# Patient Record
Sex: Male | Born: 1985 | Race: White | Hispanic: No | Marital: Single | Smoking: Former smoker
Health system: Southern US, Community
[De-identification: ages and names within clinical notes are randomized; demographics above are authoritative.]

## PROBLEM LIST (undated history)

## (undated) DIAGNOSIS — Z720 Tobacco use: Secondary | ICD-10-CM

## (undated) HISTORY — PX: FRACTURE SURGERY: SHX138

## (undated) HISTORY — DX: Tobacco use: Z72.0

---
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
## 2016-08-30 ENCOUNTER — Emergency Department (HOSPITAL_COMMUNITY)
Admission: EM | Admit: 2016-08-30 | Discharge: 2016-08-30 | Disposition: A | Discharge status: Home / Self Care | Payer: 59 | Attending: Emergency Medicine | Admitting: Emergency Medicine

## 2016-08-30 ENCOUNTER — Emergency Department (HOSPITAL_COMMUNITY): Payer: 59

## 2016-08-30 ENCOUNTER — Encounter (HOSPITAL_COMMUNITY): Payer: Self-pay | Admitting: Emergency Medicine

## 2016-08-30 DIAGNOSIS — Z7982 Long term (current) use of aspirin: Secondary | ICD-10-CM | POA: Diagnosis not present

## 2016-08-30 DIAGNOSIS — F17210 Nicotine dependence, cigarettes, uncomplicated: Secondary | ICD-10-CM | POA: Diagnosis not present

## 2016-08-30 DIAGNOSIS — R079 Chest pain, unspecified: Secondary | ICD-10-CM | POA: Diagnosis present

## 2016-08-30 DIAGNOSIS — R0789 Other chest pain: Secondary | ICD-10-CM | POA: Diagnosis not present

## 2016-08-30 DIAGNOSIS — Z79899 Other long term (current) drug therapy: Secondary | ICD-10-CM | POA: Diagnosis not present

## 2016-08-30 LAB — CBC WITH DIFFERENTIAL/PLATELET
Basophils Absolute: 0 10*3/uL (ref 0.0–0.1)
Basophils Relative: 0 %
EOS ABS: 0.2 10*3/uL (ref 0.0–0.7)
Eosinophils Relative: 3 %
HEMATOCRIT: 42.6 % (ref 39.0–52.0)
HEMOGLOBIN: 15.2 g/dL (ref 13.0–17.0)
LYMPHS ABS: 2 10*3/uL (ref 0.7–4.0)
Lymphocytes Relative: 26 %
MCH: 30.5 pg (ref 26.0–34.0)
MCHC: 35.7 g/dL (ref 30.0–36.0)
MCV: 85.4 fL (ref 78.0–100.0)
MONO ABS: 0.5 10*3/uL (ref 0.1–1.0)
MONOS PCT: 7 %
Neutro Abs: 5 10*3/uL (ref 1.7–7.7)
Neutrophils Relative %: 64 %
Platelets: 170 10*3/uL (ref 150–400)
RBC: 4.99 MIL/uL (ref 4.22–5.81)
RDW: 12.3 % (ref 11.5–15.5)
WBC: 7.8 10*3/uL (ref 4.0–10.5)

## 2016-08-30 LAB — COMPREHENSIVE METABOLIC PANEL
ALK PHOS: 63 U/L (ref 38–126)
ALT: 27 U/L (ref 17–63)
AST: 24 U/L (ref 15–41)
Albumin: 4.9 g/dL (ref 3.5–5.0)
Anion gap: 6 (ref 5–15)
BILIRUBIN TOTAL: 0.7 mg/dL (ref 0.3–1.2)
BUN: 16 mg/dL (ref 6–20)
CALCIUM: 9.1 mg/dL (ref 8.9–10.3)
CO2: 25 mmol/L (ref 22–32)
Chloride: 105 mmol/L (ref 101–111)
Creatinine, Ser: 1.09 mg/dL (ref 0.61–1.24)
GFR calc non Af Amer: >60 mL/min (ref >60)
Glucose, Bld: 100 mg/dL — ABNORMAL HIGH (ref 65–99)
Potassium: 4 mmol/L (ref 3.5–5.1)
Sodium: 136 mmol/L (ref 135–145)
TOTAL PROTEIN: 7.3 g/dL (ref 6.5–8.1)

## 2016-08-30 LAB — I-STAT TROPONIN, ED
TROPONIN I, POC: 0 ng/mL (ref 0.00–0.08)
Troponin i, poc: 0 ng/mL (ref 0.00–0.08)

## 2016-08-30 NOTE — ED Notes (Signed)
EDP at bedside  

## 2016-08-30 NOTE — ED Provider Notes (Signed)
[facility]Y DEPT Provider Note   CSN: [id number] Arrival date & time: 08/30/16  1539     History   Chief Complaint Chief Complaint  Patient presents with  . Chest Pain    HPI Gary Holmes is a 31 y.o. male.  Patient presents with history of palpitations, diaphoresis, left-sided chest pain, lightheadedness occurred approximately 5 hours, while at work. Patient states that he all of a sudden became sweaty with associated chest pain after which he took off his shirt and stood in front of a fan. States the sweating resolved but palpitations continued . He went to the fire station next-door and states that when his blood pressure was checked it was 170/110. They were unable to check his blood sugar due to his hands being cold. Patient states he had a normal meal for breakfast this morning. He states he has never had shortness of breath at baseline or with increased activity. Denies appetite changes, urinary symptoms, bowel changes, headache, vision changes, prior cardiac history, prior cardiac workup, prior history of the symptoms, syncope, recent illness, fever, recent injury. Of note, patient states that he has not seen a primary care provider in several years. States family history of CAD, his paternal grandfather died of a heart attack at age 33, and his father had a heart attack in his 53s as well.      History reviewed. No pertinent past medical history.  There are no active problems to display for this patient.   Past Surgical History:  Procedure Laterality Date  . FRACTURE SURGERY         Home Medications    Prior to Admission medications   Medication Sig Start Date End Date Taking? Authorizing Provider  acetaminophen (TYLENOL) 500 MG tablet Take 1,000 mg by mouth every 6 (six) hours as needed for headache (pain).   Yes Historical Provider, MD  aspirin EC 81 MG tablet Take 81 mg by mouth at bedtime.   Yes Historical Provider, MD  Multiple Vitamin (MULTIVITAMIN  WITH MINERALS) TABS tablet Take 1 tablet by mouth at bedtime.   Yes Historical Provider, MD  Omega-3 Fatty Acids (FISH OIL) 1000 MG CAPS Take 1,000 mg by mouth at bedtime.   Yes Historical Provider, MD    Family History No family history on file.  Social History Social History  Substance Use Topics  . Smoking status: Current Some Day Smoker  . Smokeless tobacco: Current User    Types: Chew  . Alcohol use Yes     Allergies   Patient has no known allergies.   Review of Systems Review of Systems  Constitutional: Positive for diaphoresis. Negative for appetite change, chills and fever.  HENT: Negative for ear pain, rhinorrhea, sneezing and sore throat.   Eyes: Negative for photophobia and visual disturbance.  Respiratory: Negative for cough, chest tightness, shortness of breath and wheezing.   Cardiovascular: Positive for chest pain and palpitations.  Gastrointestinal: Negative for abdominal pain, blood in stool, constipation, diarrhea, nausea and vomiting.  Genitourinary: Negative for dysuria, hematuria and urgency.  Musculoskeletal: Negative for myalgias.  Skin: Negative for rash.  Neurological: Positive for light-headedness. Negative for dizziness and weakness.     Physical Exam Updated Vital Signs BP 121/64   Pulse 66   Temp 97.8 F (36.6 C) (Oral)   Resp 20   Ht  (1.803 m)   Wt 104.3 kg   SpO2 100%   BMI 32.08 kg/m   Physical Exam  Constitutional: He is oriented to  person, place, and time. He appears well-developed and well-nourished. No distress.  HENT:  Head: Normocephalic and atraumatic.  Nose: Nose normal.  Eyes: Conjunctivae and EOM are normal. Pupils are equal, round, and reactive to light. Right eye exhibits no discharge. Left eye exhibits no discharge. No scleral icterus.  Neck: Normal range of motion. Neck supple.  Cardiovascular: Normal rate, regular rhythm, normal heart sounds and intact distal pulses.  Exam reveals no gallop and no friction  rub.   No murmur heard. Pulmonary/Chest: Effort normal and breath sounds normal. No respiratory distress.  Abdominal: Soft. Bowel sounds are normal. He exhibits no distension. There is no tenderness. There is no guarding.  Musculoskeletal: Normal range of motion. He exhibits no edema.  Neurological: He is alert and oriented to person, place, and time. He exhibits normal muscle tone. Coordination normal.  Skin: Skin is warm and dry. No rash noted. He is not diaphoretic.  Psychiatric: He has a normal mood and affect.  Nursing note and vitals reviewed.    ED Treatments / Results  Labs (all labs ordered are listed, but only abnormal results are displayed) Labs Reviewed  COMPREHENSIVE METABOLIC PANEL - Abnormal; Notable for the following:       Result Value   Glucose, Bld 100 (*)    All other components within normal limits  CBC WITH DIFFERENTIAL/PLATELET  Rosezena Sensor, ED  I-STAT TROPOININ, ED    EKG  EKG Interpretation  Date/Time:  Sunday August 30 2016 15:44:11 EDT Ventricular Rate:  72 PR Interval:  154 QRS Duration: 88 QT Interval:  378 QTC Calculation: 413 R Axis:   69 Text Interpretation:  Normal sinus rhythm Normal ECG Confirmed by Fayrene Fearing  MD, MARK ([id number]) on 08/30/2016 5:24:41 PM       Radiology Dg Chest 2 View  Result Date: 08/30/2016 CLINICAL DATA:  Left-sided chest pain and tachycardia EXAM: CHEST  2 VIEW COMPARISON:  None. FINDINGS: The heart size and mediastinal contours are within normal limits. Both lungs are clear. The visualized skeletal structures are unremarkable. IMPRESSION: No active cardiopulmonary disease. Electronically Signed   By: Alcide Clever M.D.   On: 08/30/2016 17:08    Procedures Procedures (including critical care time)  Medications Ordered in ED Medications - No data to display   Initial Impression / Assessment and Plan / ED Course  I have reviewed the triage vital signs and the nursing notes.  Pertinent labs & imaging results that  were available during my care of the patient were reviewed by me and considered in my medical decision making (see chart for details).     Patient's history and symptoms concerning for dehydration vs. Low likelihood of ACS versus PNA vs. Arrhythmia vs. Anemia vs. PE. Troponin negative x1. CBC and CMP unremarkable. EKG showed NSR with no WPW, Brugada, long QT, LVH, SVT. CXR showed no active cardiopulmonary disease. Cardiac monitor showed stable heart rhythm. Vital signs are stable in the room. Patient states that all of his symptoms have resolved. Low likelihood of PE, due to the absence of leg swelling, absence of cancer history, normal heart rate currently, no hemoptysis, no prior clots, no recent surgery or immobilization. It is important that patient follows up with cardiologist and establish a PCP. He is aware that he has not seen a PCP in several years and I educated him on the importance of preventative medicine.  Patient has a HEART score of 2 based on history and risk factors. This warrants a delta troponin which returned as  negative as well. Symptoms likely due to arrhythmia such as SVT and less likely CAD. However will need further evaluation. Patient appears stable here in the ED. Information given as stated above regarding scheduling appointments with cardiologist and PCP. Return precautions given.    Final Clinical Impressions(s) / ED Diagnoses   Final diagnoses:  Chest wall pain    New Prescriptions New Prescriptions   No medications on file     Brooks Sailors 08/30/16 1937    Rolland Porter, MD 09/05/16 1650

## 2016-08-30 NOTE — ED Notes (Signed)
Pt updated on plan to repeat Troponin @ 1900.  Pt hungry.  Happy meal, peanut butter, crackers, and sprite given to pt.  Drink also given to spouse.

## 2016-08-30 NOTE — ED Notes (Signed)
Pt to xray

## 2016-08-30 NOTE — ED Provider Notes (Signed)
Patient seen and evaluated, discussed  with PA Hina Khatri.  She describes episode of diaphoresis palpitations and anterior chest pain. Half an hour later as he was improving presented to a fire station and underwent vital signs. He does not recall being told that his pulse was but his blood pressure was high for him at 170/110. Heart score of 2. A symptomatically here. Normal vitals. Heart rate 50 EKG normal first troponin normal. Recommend second troponin.  Arrhythmia is a possibility. I recommended the patient routinely follow up with cardiology. If he has recurrence between now and cardiology evaluation, I've recommended he go immediately to Hospital or call ambulance so that his rhythm can be captured. Avoid caffeine. ER with acute changes.   Rolland Porter, MD 08/30/16 [phone]

## 2016-08-30 NOTE — Discharge Instructions (Signed)
Please schedule an appointment with cardiologist listed below. Please establish primary care with information given below. Return to ED for worsening pain, trouble breathing, leg swelling, coughing up blood, vision changes, additional injury, persistent chest pain.

## 2016-08-30 NOTE — ED Triage Notes (Signed)
Pt to this am he felt his heart beating fast and became diaphoretic. St's he felt like he was going to pass out.  At this time pt denies any pain or any other symptoms at this time

## 2016-08-30 NOTE — ED Notes (Signed)
Returned from xray

## 2016-09-11 ENCOUNTER — Encounter: Payer: Self-pay | Admitting: Physician Assistant

## 2016-09-14 ENCOUNTER — Encounter: Payer: Self-pay | Admitting: Physician Assistant

## 2016-09-14 DIAGNOSIS — R002 Palpitations: Secondary | ICD-10-CM | POA: Insufficient documentation

## 2016-09-14 DIAGNOSIS — R61 Generalized hyperhidrosis: Secondary | ICD-10-CM | POA: Insufficient documentation

## 2016-09-14 DIAGNOSIS — R0789 Other chest pain: Secondary | ICD-10-CM | POA: Insufficient documentation

## 2016-09-14 NOTE — Progress Notes (Signed)
Cardiology Office Note    Date:  09/15/2016  ID:  Gary Holmes, DOB Sep 25, 1985, MRN [id number] PCP:  No PCP Per Patient  Cardiologist:  New, reviewed with Dr. Elease Hashimoto   Chief Complaint: palpitations, chest pain  History of Present Illness:  Gary Holmes is a 31 y.o. male [facility] with history of no significant PMH except social tobacco and alcohol use who was recently seen in the ED for cardiac evaluation. Two weeks ago, he was seated at work when he developed profuse sweating and left sided chest tightness associated with palpitations/racing heart and lightheadedness. He took all his gear off and stood in front of the fan with no relief. Symptoms seemed to persist for about an hour and a half. His coworkers convinced him to go to the fire station next-door and states that when his blood pressure was checked it was 170/110. HR unknown. Symptoms had improved spontaneously. His wife took him to urgent care to be checked out but he developed recurrent diaphoresis in the waiting room there so they were referred to the ED. Troponins neg x 2. CMET and CBC wnl except glucose 100 (1 pt above normal). BP in ED 121/64, pulse ox 100%. CXR NAD. He has not had any recurrent episodes. He works out regularly to stay active for his job. 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 No chest pain, dyspnea, syncope. No other symptoms including LEE. No family history of sudden cardiac death but + family hx of premature CAD. Father had MI at 61 and his paternal grandfather had MI at 55 (multiple other family members on father's side also had heart issues in their 70s). [name] last smoked a year ago but dips regularly - has found it hard to quit this because so many people at his job do. There was a biometric screening at his job recently and he states his BP was 130s/70s. He also states he had his cholesterol checked by them and was told it was  OK. He is asymptomatic today. He has no prior history of cardiac workup or similar symptoms.    Past Medical History:  Diagnosis Date  . Tobacco use     Past Surgical History:  Procedure Laterality Date  . FRACTURE SURGERY      Current Medications: Current Outpatient Prescriptions  Medication Sig Dispense Refill  . acetaminophen (TYLENOL) 500 MG tablet Take 1,000 mg by mouth every 6 (six) hours as needed for headache (pain).    Marland Kitchen aspirin EC 81 MG tablet Take 81 mg by mouth at bedtime.    . Multiple Vitamin (MULTIVITAMIN WITH MINERALS) TABS tablet Take 1 tablet by mouth at bedtime.    . Omega-3 Fatty Acids (FISH OIL) 1000 MG CAPS Take 1,000 mg by mouth at bedtime.     No current facility-administered medications for this visit.      Allergies:   Patient has no known allergies.   Social History   Social History  . Marital status: Single    Spouse name: N/A  . Number of children: N/A  . Years of education: N/A   Social History Main Topics  . Smoking status: Passive Smoke Exposure - Never Smoker    Packs/day: 0.25    Types: Cigarettes  . Smokeless tobacco: Current User    Types: Snuff     Comment: Occasional smoker when drinking beer only, also dips regularly  . Alcohol use Yes  Comment: Attends parties every other weekend  . Drug use: No  . Sexual activity: Not Asked   Other Topics Concern  . None   Social History Narrative  . None     Family History:  Family History  Problem Relation Age of Onset  . Heart attack Father 22  . Heart attack Paternal Grandfather 47  . Fainting Paternal Uncle      ROS:   Please see the history of present illness.  All other systems are reviewed and otherwise negative.    PHYSICAL EXAM:   VS:  BP 134/90   Pulse 75   Ht  (1.803 m)   Wt 234 lb 4 oz (106.3 kg)   SpO2 97%   BMI 32.67 kg/m   BMI: Body mass index is 32.67 kg/m. GEN: Well nourished, well developed WM, in no acute distress  HEENT: normocephalic,  atraumatic Neck: no JVD, carotid bruits, or masses Cardiac: RRR; no murmurs, rubs, or gallops, no edema  Respiratory:  clear to auscultation bilaterally, normal work of breathing GI: soft, nontender, nondistended, + BS MS: no deformity or atrophy  Skin: warm and dry, no rash Neuro:  Alert and Oriented x 3, Strength and sensation are intact, follows commands Psych: euthymic mood, full affect  Wt Readings from Last 3 Encounters:  09/15/16 234 lb 4 oz (106.3 kg)  08/30/16 230 lb (104.3 kg)      Studies/Labs Reviewed:   EKG:  EKG was ordered today and personally reviewed by me and demonstrates NSR 79bpm, TWI III, otherwise normal. No delta wave, PR , QTc . No sig change from ED EKG.  Recent Labs: 08/30/2016: ALT 27; BUN 16; Creatinine, Ser 1.09; Hemoglobin 15.2; Platelets 170; Potassium 4.0; Sodium 136   Lipid Panel No results found for: CHOL, TRIG, HDL, CHOLHDL, VLDL, LDLCALC, LDLDIRECT  Additional studies/ records that were reviewed today include: Summarized above.    ASSESSMENT & PLAN:   The patient's case was reviewed with Dr. Elease Hashimoto since this is a new patient to our clinic. Plan formulated below per our discussion.  1. Heart palpitations - somewhat atypical, in NSR at time of ED visit. Question vagal event. However, cannot rule out SVT. Will check TSH today. I instructed him that if he has any recurrence to call EMS immediately so we can see if we can capture any arrhythmias on 12-lead at time of ongoing symptoms. Will await EF assessment by stress echo as discussed below. If symptoms recur, will need event monitoring. Exam and EKG are benign. 2. Chest pain - mixed atypical/typical features. Has been able to exercise since the episode without anginal symptoms. Will proceed with stress echocardiogram for risk stratification. This will allow [place] to observe for any arrhythmias, BP response or ischemia. Continue aspirin. We discussed importance of working on his risk factors  especially given fam hx. He will bring in a copy of his lipid panel when he returns for his stress test for [place] to scan in. Would advocate for tight lipid control if LDL is elevated. 3. Elevated blood pressure without diagnosis of hypertension - discussed lifestyle modifications including cutting down alcohol use. F/u BP response to exercise. Also asked patient to monitor episodically and bring in readings to f/u appointment.  4. Tobacco abuse/habitual alcohol intake - counseled regarding importance of complete cessation particularly in light of family history.  Disposition: F/u with myself after above testing.   Medication Adjustments/Labs and Tests Ordered: Current medicines are reviewed at length with the patient today.  Concerns regarding medicines are outlined above. Medication changes, Labs and Tests ordered today are summarized above and listed in the Patient Instructions accessible in Encounters.   Thomasene Mohair PA-C  09/15/2016 9:29 AM    [facility] [address] [place], [place], [place]  [address] Phone: [phone]; Fax: [phone]

## 2016-09-15 ENCOUNTER — Encounter: Payer: Self-pay | Admitting: Physician Assistant

## 2016-09-15 ENCOUNTER — Encounter (INDEPENDENT_AMBULATORY_CARE_PROVIDER_SITE_OTHER): Payer: Self-pay

## 2016-09-15 ENCOUNTER — Ambulatory Visit (INDEPENDENT_AMBULATORY_CARE_PROVIDER_SITE_OTHER): Payer: 59 | Admitting: Physician Assistant

## 2016-09-15 VITALS — BP 134/90 | HR 75 | Ht 71.0 in | Wt 234.2 lb

## 2016-09-15 DIAGNOSIS — R002 Palpitations: Secondary | ICD-10-CM

## 2016-09-15 DIAGNOSIS — R030 Elevated blood-pressure reading, without diagnosis of hypertension: Secondary | ICD-10-CM

## 2016-09-15 DIAGNOSIS — Z7289 Other problems related to lifestyle: Secondary | ICD-10-CM

## 2016-09-15 DIAGNOSIS — Z720 Tobacco use: Secondary | ICD-10-CM | POA: Diagnosis not present

## 2016-09-15 DIAGNOSIS — F1090 Alcohol use, unspecified, uncomplicated: Secondary | ICD-10-CM

## 2016-09-15 DIAGNOSIS — R072 Precordial pain: Secondary | ICD-10-CM

## 2016-09-15 LAB — TSH: TSH: 2.07 u[IU]/mL (ref 0.450–4.500)

## 2016-09-15 NOTE — Patient Instructions (Addendum)
Medication Instructions:    Your physician recommends that you continue on your current medications as directed. Please refer to the Current Medication list given to you today.  Labwork:  TSH today  Testing/Procedures: Your physician has requested that you have a stress echocardiogram. For further information please visit [email or web address] Please follow instruction sheet as given.  Follow-Up:  Your physician recommends that you schedule a follow-up appointment with Ronie Spies, PA once stress echocardiogram is completed.  - If you need a refill on your cardiac medications before your next appointment, please call your pharmacy.   Thank you for choosing [facility]!!    Any Other Special Instructions Will Be Listed Below (If Applicable).  - Please bring a copy of your recent cholesterol check when you come back to the office.   Exercise Stress Electrocardiogram An exercise stress electrocardiogram is a test to check how blood flows to your heart. It is done to find areas of poor blood flow. You will need to walk on a treadmill for this test. The electrocardiogram will record your heartbeat when you are at rest and when you are exercising. What happens before the procedure?  Do not have drinks with caffeine or foods with caffeine for 24 hours before the test, or as told by your doctor. This includes coffee, tea (even decaf tea), sodas, chocolate, and cocoa.  Follow your doctor's instructions about eating and drinking before the test.  Ask your doctor what medicines you should or should not take before the test. Take your medicines with water unless told by your doctor not to.  If you use an inhaler, bring it with you to the test.  Bring a snack to eat after the test.  Do not  smoke for 4 hours before the test.  Do not put lotions, powders, creams, or oils on your chest before the test.  Wear comfortable shoes and clothing. What happens during the procedure?  You will  have patches put on your chest. Small areas of your chest may need to be shaved. Wires will be connected to the patches.  Your heart rate will be watched while you are resting and while you are exercising.  You will walk on the treadmill. The treadmill will slowly get faster to raise your heart rate.  The test will take about 1-2 hours. What happens after the procedure?  Your heart rate and blood pressure will be watched after the test.  You may return to your normal diet, activities, and medicines or as told by your doctor. This information is not intended to replace advice given to you by your health care provider. Make sure you discuss any questions you have with your health care provider. Document Released: 10/21/2007 Document Revised: 01/01/2016 Document Reviewed: 01/09/2013 Elsevier Interactive Patient Education  2017 [employer].

## 2016-09-16 NOTE — Addendum Note (Signed)
Addended by: Luella Cook E on: 09/16/2016 11:42 AM   Modules accepted: Orders

## 2016-09-24 ENCOUNTER — Other Ambulatory Visit: Payer: Self-pay | Admitting: *Deleted

## 2016-09-24 DIAGNOSIS — R002 Palpitations: Secondary | ICD-10-CM

## 2016-09-24 DIAGNOSIS — R072 Precordial pain: Secondary | ICD-10-CM

## 2016-10-01 ENCOUNTER — Telehealth (HOSPITAL_COMMUNITY): Payer: Self-pay | Admitting: *Deleted

## 2016-10-01 NOTE — Telephone Encounter (Signed)
Patient given detailed instructions per Stress Test Requisition Sheet for test on 10/08/16 at 2:30.Patient Notified to arrive 30 minutes early, and that it is imperative to arrive on time for appointment to keep from having the test rescheduled.  Patient verbalized understanding. Daneil DolinSharon S Brooks

## 2016-10-07 ENCOUNTER — Other Ambulatory Visit (HOSPITAL_COMMUNITY): Payer: 59

## 2016-10-08 ENCOUNTER — Other Ambulatory Visit: Payer: Self-pay

## 2016-10-08 ENCOUNTER — Ambulatory Visit (HOSPITAL_COMMUNITY): Payer: 59 | Attending: Cardiovascular Disease

## 2016-10-08 ENCOUNTER — Ambulatory Visit (HOSPITAL_BASED_OUTPATIENT_CLINIC_OR_DEPARTMENT_OTHER): Payer: 59

## 2016-10-08 ENCOUNTER — Ambulatory Visit (HOSPITAL_COMMUNITY): Payer: 59

## 2016-10-08 DIAGNOSIS — Z720 Tobacco use: Secondary | ICD-10-CM | POA: Insufficient documentation

## 2016-10-08 DIAGNOSIS — R072 Precordial pain: Secondary | ICD-10-CM | POA: Diagnosis not present

## 2016-10-08 DIAGNOSIS — R002 Palpitations: Secondary | ICD-10-CM | POA: Diagnosis not present

## 2016-10-08 DIAGNOSIS — Z8249 Family history of ischemic heart disease and other diseases of the circulatory system: Secondary | ICD-10-CM | POA: Diagnosis not present

## 2016-10-08 DIAGNOSIS — I10 Essential (primary) hypertension: Secondary | ICD-10-CM | POA: Diagnosis not present

## 2016-10-08 DIAGNOSIS — I071 Rheumatic tricuspid insufficiency: Secondary | ICD-10-CM | POA: Diagnosis not present

## 2016-10-08 DIAGNOSIS — R0600 Dyspnea, unspecified: Secondary | ICD-10-CM | POA: Diagnosis not present

## 2016-10-14 ENCOUNTER — Telehealth: Payer: Self-pay | Admitting: Cardiovascular Disease

## 2016-10-14 NOTE — Telephone Encounter (Signed)
Mr. Neysa BonitoChristy is wanting to have his cholesterol check on the day of his appt next week. Will need orders put in . Thanks

## 2016-10-14 NOTE — Telephone Encounter (Signed)
Returned pts call.  He will bring what he has that was recently checked.  Pt was advised to come to his appt fasting and if Dayn Dunn, PA-C, wanted to recheck anything, they will order it at that time.  Pt verbalized understanding.

## 2016-10-20 NOTE — Progress Notes (Signed)
Cardiology Office Note    Date:  10/21/2016  ID:  Gary Holmes, DOB 12/29/1985, MRN [id number] PCP:  Patient, No Pcp Per  Cardiologist:  Reviewed with Dr. Elease Hashimoto last visit   Chief Complaint: f/u chest pain and palpitations  History of Present Illness:  Gary Holmes is a 31 y.o. male with history of [facility] with history of no significant PMH except social tobacco and alcohol use who presents for post-testing follow-up.  In 08/2016 he was seated at work when he developed profuse sweating and left sided chest tightness associated with palpitations/racing heart and lightheadedness. He took all his gear off and stood in front of the fan with no relief. Symptoms seemed to persist for about an hour and a half. His coworkers convinced him to go to the fire station next-door and states that when his blood pressure was checked it was 170/110. HR unknown. Symptoms had improved spontaneously. His wife took him to urgent care to be checked out but he developed recurrent diaphoresis in the waiting room there so they were referred to the ED. Troponins neg x 2. CMET and CBC unremarkable, TSH wnl. BP in ED 121/64, pulse ox 100%. CXR NAD. He was asked to f/u with cardiology. At time of that OV, no further sx - back to exercising without functional limitation. BP was 134/90. He also reported recent biometric screening at his job and was told it was OK. No family history of sudden cardiac death but + family hx of premature CAD. Father had MI at 37 and his paternal grandfather had MI at 65 (multiple other family members on father's side also had heart issues in their 54s). 2D echo 10/08/16 was normal, EF 60-65%, mildly dilated RA. Stress echo 10/08/16 was normal.  He presents back for follow-up today feeling well. He's not had any further episodes like the above. He has established with primary care at [facility] and they plan for a CPE in another month. He's been  exercising without any adverse limitation. He quit tobacco. His BP today is on the lower side but he is asymptomatic. He brings in his biometric screening today which showed a total cholesterol of 215 by fingerstick but did not have information regarding his triglycerides or LDL.    Past Medical History:  Diagnosis Date  . Tobacco use     Past Surgical History:  Procedure Laterality Date  . FRACTURE SURGERY      Current Medications: Current Outpatient Prescriptions  Medication Sig Dispense Refill  . acetaminophen (TYLENOL) 500 MG tablet Take 1,000 mg by mouth every 6 (six) hours as needed for headache (pain).    Marland Kitchen aspirin EC 81 MG tablet Take 81 mg by mouth at bedtime.    . Multiple Vitamin (MULTIVITAMIN WITH MINERALS) TABS tablet Take 1 tablet by mouth at bedtime.    . Omega-3 Fatty Acids (FISH OIL) 1000 MG CAPS Take 1,000 mg by mouth at bedtime.     No current facility-administered medications for this visit.      Allergies:   Patient has no known allergies.   Social History   Social History  . Marital status: Single    Spouse name: N/A  . Number of children: N/A  . Years of education: N/A   Social History Main Topics  . Smoking status: Former Smoker    Packs/day: 0.25    Types: Cigarettes  . Smokeless tobacco: Current User    Types: Snuff  Comment: Occasional smoker when drinking beer only, also dips regularly  . Alcohol use Yes     Comment: Attends parties every other weekend  . Drug use: No  . Sexual activity: Not Asked   Other Topics Concern  . None   Social History Narrative  . None     Family History:  Family History  Problem Relation Age of Onset  . Heart attack Father 5440  . Heart attack Paternal Grandfather 6032  . Fainting Paternal Uncle     ROS:   Please see the history of present illness.  All other systems are reviewed and otherwise negative.    PHYSICAL EXAM:   VS:  BP 100/78   Pulse 60   Ht 5\' 10"  (1.778 m)   Wt 235 lb (106.6 kg)    BMI 33.72 kg/m   BMI: Body mass index is 33.72 kg/m. GEN: Well nourished, well developed fit appearing WM, in no acute distress  HEENT: normocephalic, atraumatic Neck: no JVD, carotid bruits, or masses Cardiac: RRR; no murmurs, rubs, or gallops, no edema  Respiratory:  clear to auscultation bilaterally, normal work of breathing GI: soft, nontender, nondistended, + BS MS: no deformity or atrophy  Skin: warm and dry, no rash Neuro:  Alert and Oriented x 3, Strength and sensation are intact, follows commands Psych: euthymic mood, full affect  Wt Readings from Last 3 Encounters:  10/21/16 235 lb (106.6 kg)  09/15/16 234 lb 4 oz (106.3 kg)  08/30/16 230 lb (104.3 kg)      Studies/Labs Reviewed:   EKG:  EKG was not ordered today.  Recent Labs: 08/30/2016: ALT 27; BUN 16; Creatinine, Ser 1.09; Hemoglobin 15.2; Platelets 170; Potassium 4.0; Sodium 136 09/15/2016: TSH 2.070   Lipid Panel No results found for: CHOL, TRIG, HDL, CHOLHDL, VLDL, LDLCALC, LDLDIRECT  Additional studies/ records that were reviewed today include: Summarized above    ASSESSMENT & PLAN:   1. Heart palpitations - no further recurrence. Etiology not clear at this time. We discussed that if this should occur again, he should call EMS immediately so that they can capture an EKG at time of symptoms (to identify SVT, for example). May need event monitor in the future if he has recurrence. Otherwise patient will monitor for any further symptoms. 2. Chest pain - mixed atypical/typical features - resolved. Stress echo and full echo generally unrevealing. Mild RA dilitation noted but no significant valvular disease noted. 3. Elevated blood pressure without diagnosis of hypertension - BP today is actually on the low side. Discussed importance of regular monitoring by primary care as new recommendations dictate a goal BP of 120/80 or less. If he begins to run in the 130s again would need to consider addition of low dose  antihypertensive as BP tolerates. Discussed reduction in sodium and alcohol in diet. 4. Tobacco abuse/habitual alcohol intake - congratulated him on cessation of tobacco. We discussed working on decreasing EtOH intake. 5. Hyperlipidemia - reviewed biometric screening - Tchol 225. Given significant family history of CAD, will check fasting lipid panel today.  Disposition: F/u with cardiology PRN.   Medication Adjustments/Labs and Tests Ordered: Current medicines are reviewed at length with the patient today.  Concerns regarding medicines are outlined above. Medication changes, Labs and Tests ordered today are summarized above and listed in the Patient Instructions accessible in Encounters.   Signed, Laurann Montanaayna N Jamison Yuhasz, PA-C  10/21/2016 9:48 AM    [facility] [address], [place], [place]  [address]  Phone: [phone]; Fax: [phone]

## 2016-10-21 ENCOUNTER — Encounter (INDEPENDENT_AMBULATORY_CARE_PROVIDER_SITE_OTHER): Payer: Self-pay

## 2016-10-21 ENCOUNTER — Ambulatory Visit (INDEPENDENT_AMBULATORY_CARE_PROVIDER_SITE_OTHER): Payer: 59 | Admitting: Physician Assistant

## 2016-10-21 ENCOUNTER — Encounter: Payer: Self-pay | Admitting: Physician Assistant

## 2016-10-21 VITALS — BP 100/78 | HR 60 | Ht 70.0 in | Wt 235.0 lb

## 2016-10-21 DIAGNOSIS — Z720 Tobacco use: Secondary | ICD-10-CM | POA: Diagnosis not present

## 2016-10-21 DIAGNOSIS — R002 Palpitations: Secondary | ICD-10-CM | POA: Diagnosis not present

## 2016-10-21 DIAGNOSIS — R030 Elevated blood-pressure reading, without diagnosis of hypertension: Secondary | ICD-10-CM

## 2016-10-21 DIAGNOSIS — R0789 Other chest pain: Secondary | ICD-10-CM | POA: Diagnosis not present

## 2016-10-21 DIAGNOSIS — E785 Hyperlipidemia, unspecified: Secondary | ICD-10-CM

## 2016-10-21 LAB — LIPID PANEL
CHOLESTEROL TOTAL: 187 mg/dL (ref 100–199)
Chol/HDL Ratio: 4.8 ratio (ref 0.0–5.0)
HDL: 39 mg/dL — AB (ref >39)
LDL Calculated: 91 mg/dL (ref 0–99)
Triglycerides: 287 mg/dL — ABNORMAL HIGH (ref 0–149)
VLDL CHOLESTEROL CAL: 57 mg/dL — AB (ref 5–40)

## 2016-10-21 NOTE — Patient Instructions (Signed)
Medication Instructions:  Your physician recommends that you continue on your current medications as directed. Please refer to the Current Medication list given to you today.   Labwork: Your physician recommends that you have a FASTING lipid profile/lipid.   Testing/Procedures: -None  Follow-Up: As needed with Dr. Elease HashimotoNahser  Any Other Special Instructions Will Be Listed Below (If Applicable).     If you need a refill on your cardiac medications before your next appointment, please call your pharmacy.

## 2016-10-27 ENCOUNTER — Other Ambulatory Visit (HOSPITAL_COMMUNITY): Payer: 59

## 2018-10-31 IMAGING — DX DG CHEST 2V
2 series · 2 of 2 positions shown · non-contrast
Comparison: None.

CLINICAL DATA: Left-sided chest pain and tachycardia

EXAM:
CHEST  2 VIEW

[w chest pa]
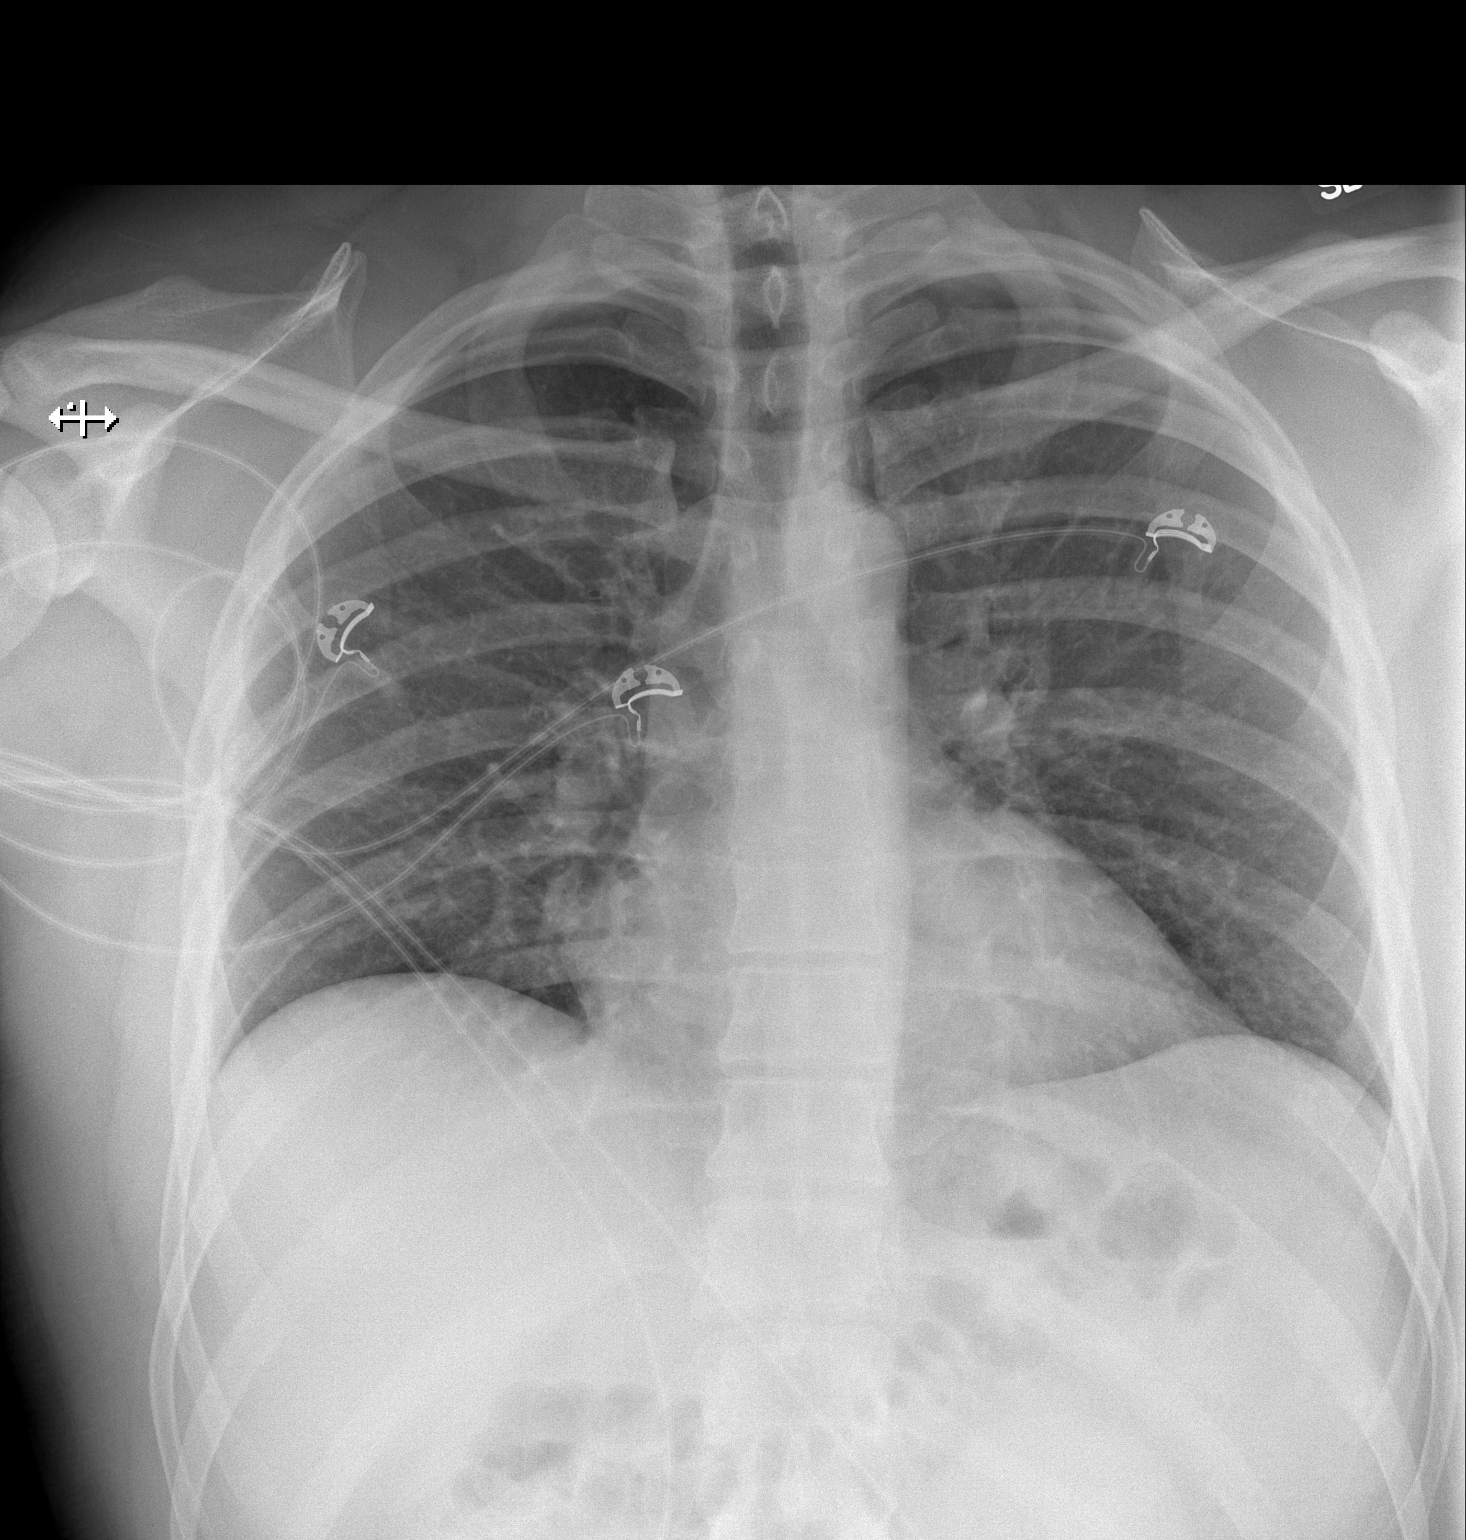

[w chest lat]
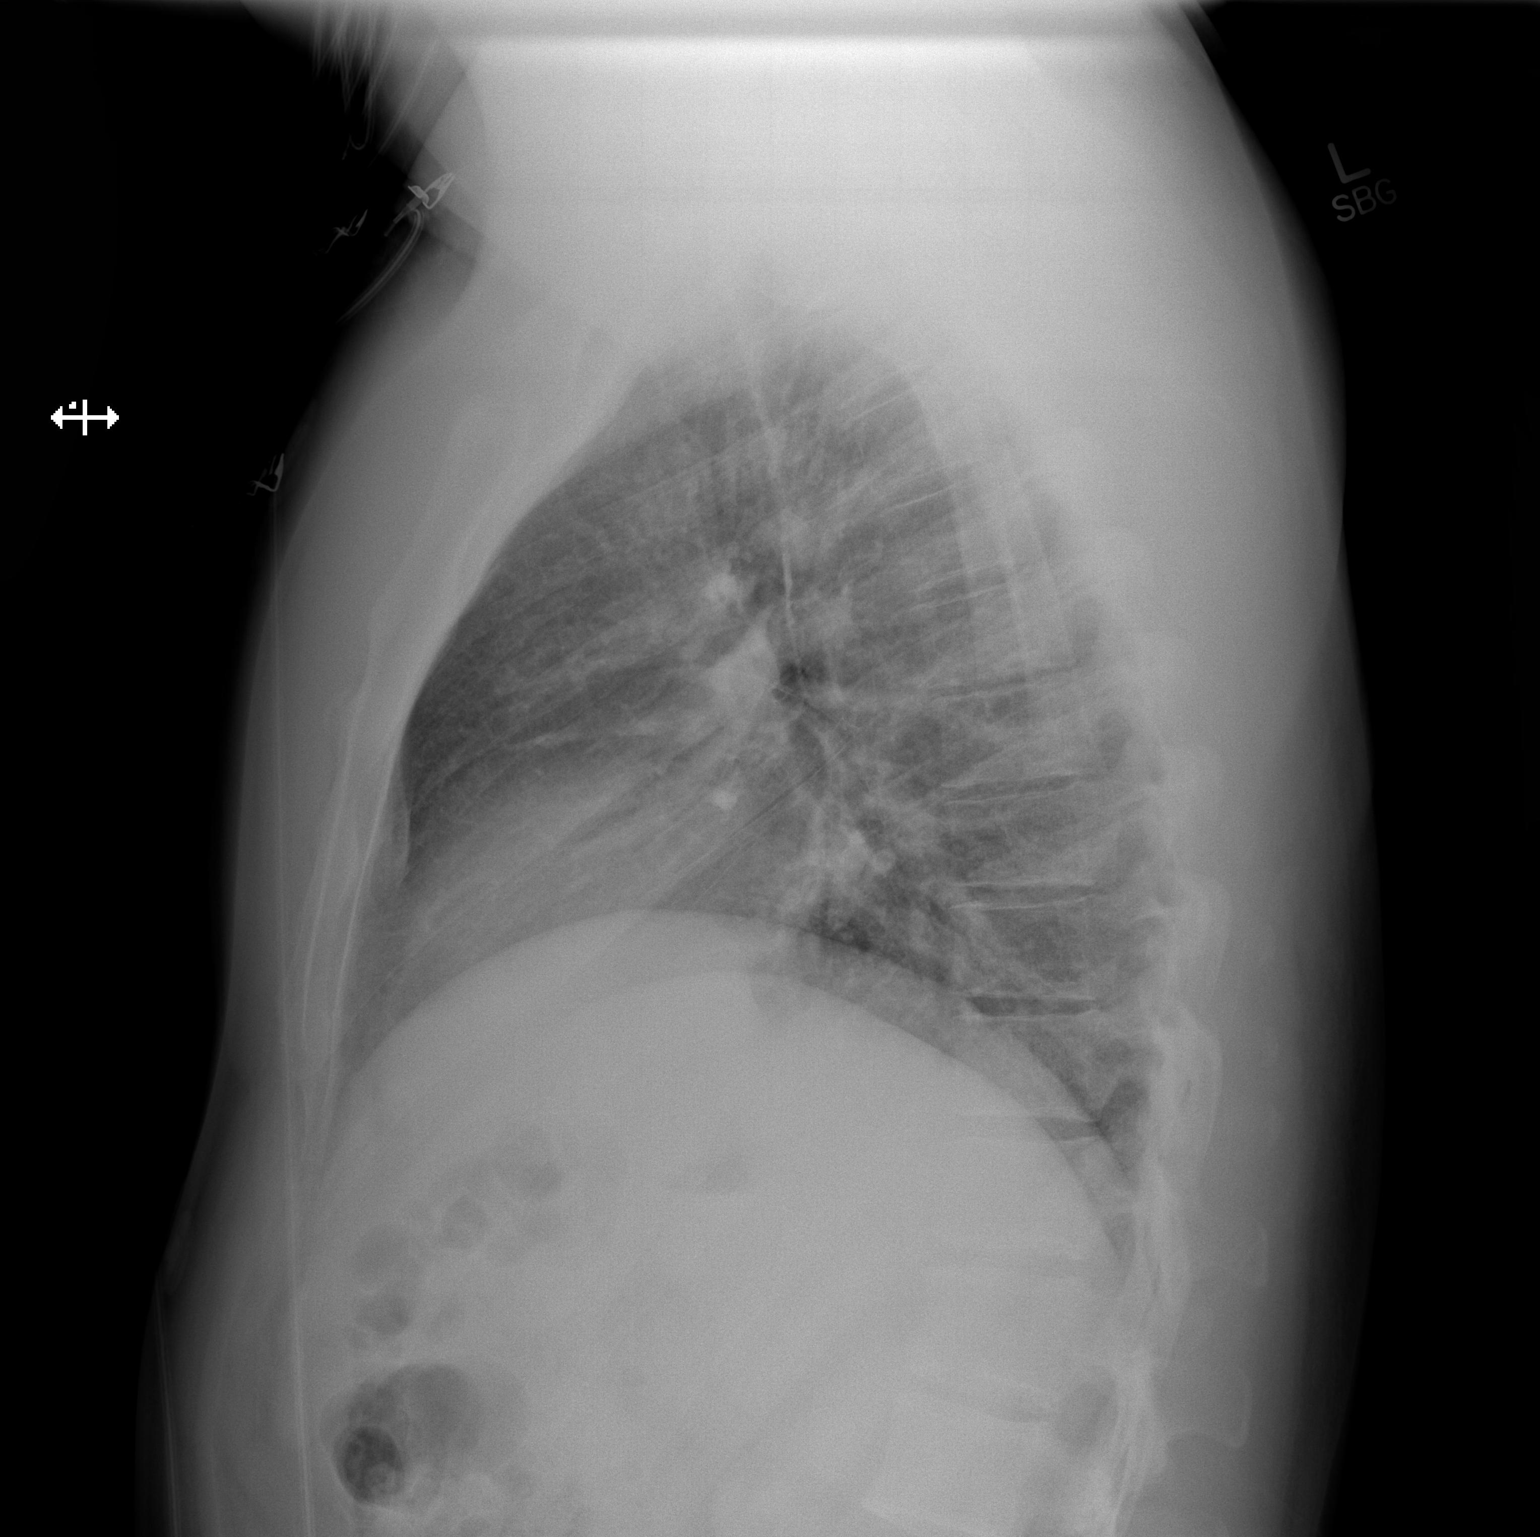

[2 of 2 positions shown; findings below may reference images not displayed]

FINDINGS: The heart size and mediastinal contours are within normal limits.
Both lungs are clear. The visualized skeletal structures are
unremarkable.
IMPRESSION: No active cardiopulmonary disease.

## 2020-07-08 ENCOUNTER — Encounter (HOSPITAL_COMMUNITY): Payer: Self-pay

## 2020-07-08 ENCOUNTER — Other Ambulatory Visit: Payer: Self-pay

## 2020-07-08 ENCOUNTER — Ambulatory Visit (HOSPITAL_COMMUNITY)
Admission: EM | Admit: 2020-07-08 | Discharge: 2020-07-08 | Disposition: A | Discharge status: Home / Self Care | Payer: Commercial Managed Care - PPO | Attending: Family Medicine | Admitting: Family Medicine

## 2020-07-08 DIAGNOSIS — S0101XA Laceration without foreign body of scalp, initial encounter: Secondary | ICD-10-CM | POA: Diagnosis not present

## 2020-07-08 MED ORDER — AMOXICILLIN-POT CLAVULANATE 875-125 MG PO TABS: 1.0000 | ORAL_TABLET | Freq: Two times a day (BID) | ORAL | 0 refills | Status: AC

## 2020-07-08 NOTE — ED Triage Notes (Signed)
Pt presents with laceration to back of head. Pt states an hour ago at work he cut himself with a chain saw. Pt states he did not feel disoriented or dizzy at that moment. He denies pain, states he feels discomfort and burning sensation.

## 2020-07-08 NOTE — ED Provider Notes (Signed)
[facility]    CSN: [id number] Arrival date & time: 07/08/20  1642      History   Chief Complaint Chief Complaint  Patient presents with  . Head Laceration    HPI Gary Holmes is a 35 y.o. male.   Presenting today with multiple scalp lacerations from a chainsaw accident that happened 2 hours ago to back of head. States bleeding has been fairly well controlled with direct pressure and pain is 5-6/10. Denies LOC during incident and not having visual changes, dizziness, N/V, altered mental status. Last tdap was in 2016 or 2017 per patient.      Past Medical History:  Diagnosis Date  . Tobacco use     Patient Active Problem List   Diagnosis Date Noted  . Palpitations   . Anterior chest wall pain   . Diaphoresis     Past Surgical History:  Procedure Laterality Date  . FRACTURE SURGERY         Home Medications    Prior to Admission medications   Medication Sig Start Date End Date Taking? Authorizing Provider  amoxicillin-clavulanate (AUGMENTIN) 875-125 MG tablet Take 1 tablet by mouth every 12 (twelve) hours. 07/08/20  Yes Particia Nearing, PA-C  acetaminophen (TYLENOL) 500 MG tablet Take 1,000 mg by mouth every 6 (six) hours as needed for headache (pain).    [provider]  aspirin EC 81 MG tablet Take 81 mg by mouth at bedtime.    [provider]  Multiple Vitamin (MULTIVITAMIN WITH MINERALS) TABS tablet Take 1 tablet by mouth at bedtime.    [provider]  Omega-3 Fatty Acids (FISH OIL) 1000 MG CAPS Take 1,000 mg by mouth at bedtime.    [provider]    Family History Family History  Problem Relation Age of Onset  . Heart attack Father 65  . Heart attack Paternal Grandfather 31  . Fainting Paternal Uncle     Social History Social History   Tobacco Use  . Smoking status: Former Smoker    Packs/day: 0.25    Types: Cigarettes  . Smokeless tobacco: Current User    Types: Snuff  . Tobacco  comment: Occasional smoker when drinking beer only, also dips regularly  Vaping Use  . Vaping Use: Never used  Substance Use Topics  . Alcohol use: Yes    Comment: Attends parties every other weekend  . Drug use: No     Allergies   Patient has no known allergies.   Review of Systems Review of Systems PER HPI    Physical Exam Triage Vital Signs ED Triage Vitals  Enc Vitals Group     BP 07/08/20 1700 (!) 141/84     Pulse Rate 07/08/20 1700 90     Resp 07/08/20 1700 17     Temp 07/08/20 1700 98.5 F (36.9 C)     Temp Source 07/08/20 1700 Oral     SpO2 07/08/20 1700 96 %     Weight --      Height --      Head Circumference --      Peak Flow --      Pain Score 07/08/20 1659 5     Pain Loc --      Pain Edu? --      Excl. in GC? --    No data found.  Updated Vital Signs BP (!) 141/84 (BP Location: Left Arm)   Pulse 90   Temp 98.5 F (36.9 C) (Oral)  Resp 17   SpO2 96%   Visual Acuity Right Eye Distance:   Left Eye Distance:   Bilateral Distance:    Right Eye Near:   Left Eye Near:    Bilateral Near:     Physical Exam Vitals and nursing note reviewed.  Constitutional:      Appearance: Normal appearance.  HENT:     Head: Atraumatic.  Eyes:     Extraocular Movements: Extraocular movements intact.     Conjunctiva/sclera: Conjunctivae normal.  Cardiovascular:     Rate and Rhythm: Normal rate and regular rhythm.  Pulmonary:     Effort: Pulmonary effort is normal.     Breath sounds: Normal breath sounds.  Musculoskeletal:        General: Normal range of motion.     Cervical back: Normal range of motion and neck supple.  Skin:    General: Skin is warm.     Comments: 2 superficial lacerations to posterior scalp both about 3 inches across. Does not extend through full thickness of dermis. Bleeding well controlled with pressure. No foreign body on exam and irrigation and edges well approximated aside from a very small tissue flap on lower laceration at  left-most edge  Neurological:     General: No focal deficit present.     Mental Status: He is oriented to person, place, and time.     Cranial Nerves: No cranial nerve deficit.     Motor: No weakness.     Gait: Gait normal.  Psychiatric:        Mood and Affect: Mood normal.        Thought Content: Thought content normal.        Judgment: Judgment normal.      UC Treatments / Results  Labs (all labs ordered are listed, but only abnormal results are displayed) Labs Reviewed - No data to display  EKG   Radiology No results found.  Procedures Laceration Repair  Date/Time: 07/08/2020 5:50 PM Performed by: Particia Nearing, PA-C Authorized by: Particia Nearing, PA-C   Consent:    Consent obtained:  Verbal   Consent given by:  Patient   Risks, benefits, and alternatives were discussed: yes     Risks discussed:  Pain, infection and need for additional repair   Alternatives discussed:  Observation Universal protocol:    Procedure explained and questions answered to patient or proxy's satisfaction: yes     Relevant documents present and verified: yes     Test results available: yes     Imaging studies available: yes     Required blood products, implants, devices, and special equipment available: yes     Site/side marked: yes     Immediately prior to procedure, a time out was called: yes     Patient identity confirmed:  Verbally with patient and arm band Anesthesia:    Anesthesia method:  None Laceration details:    Location:  Scalp   Scalp location:  Occipital   Length (cm):  8   Depth (mm):  2 Pre-procedure details:    Preparation:  Patient was prepped and draped in usual sterile fashion Exploration:    Limited defect created (wound extended): no     Hemostasis achieved with:  Direct pressure   Imaging outcome: foreign body not noted     Wound exploration: wound explored through full range of motion and entire depth of wound visualized     Contaminated:  no   Treatment:    Area  cleansed with:  Saline   Amount of cleaning:  Standard   Irrigation solution:  Sterile saline   Irrigation method:  Pressure wash   Visualized foreign bodies/material removed: no     Debridement:  None   Undermining:  None   Scar revision: no     Layers repaired: superficial subcutaneous. Skin repair:    Repair method:  Tissue adhesive Approximation:    Approximation:  Close Repair type:    Repair type:  Simple Post-procedure details:    Dressing:  Non-adherent dressing   Procedure completion:  Tolerated well, no immediate complications   (including critical care time)  Medications Ordered in UC Medications - No data to display  Initial Impression / Assessment and Plan / UC Course  I have reviewed the triage vital signs and the nursing notes.  Pertinent labs & imaging results that were available during my care of the patient were reviewed by me and considered in my medical decision making (see chart for details).     Wound not full thickness, no skull deformity palpable, and no foreign body visualized. Irrigated well with sterile saline, closed with dermabond. Patient declined staples today and opted for dermabond closure which risks and benefits were discussed. Edges well approximated with glue, non stick gauze pad and coban used to dress. Augmentin sent in case showing signs of infection. Wound care reviewed at length as well as return precautions. Tdap UTD.   Final Clinical Impressions(s) / UC Diagnoses   Final diagnoses:  Laceration of scalp, initial encounter   Discharge Instructions   None    ED Prescriptions    Medication Sig Dispense Auth. Provider   amoxicillin-clavulanate (AUGMENTIN) 875-125 MG tablet Take 1 tablet by mouth every 12 (twelve) hours. 14 tablet Particia Nearing, [place]     PDMP not reviewed this encounter.   Particia Nearing, [place] 07/08/20 1756
